# Patient Record
Sex: Male | Born: 2000 | Race: White | Hispanic: No | Marital: Single | Smoking: Never smoker
Health system: Southern US, Community
[De-identification: ages and names within clinical notes are randomized; demographics above are authoritative.]

## PROBLEM LIST (undated history)

## (undated) DIAGNOSIS — D6859 Other primary thrombophilia: Secondary | ICD-10-CM

## (undated) DIAGNOSIS — F913 Oppositional defiant disorder: Secondary | ICD-10-CM

---
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
## 2021-01-28 ENCOUNTER — Other Ambulatory Visit: Payer: Self-pay

## 2021-01-28 ENCOUNTER — Emergency Department (HOSPITAL_COMMUNITY): Payer: BC Managed Care – PPO

## 2021-01-28 ENCOUNTER — Emergency Department (HOSPITAL_COMMUNITY)
Admission: EM | Admit: 2021-01-28 | Discharge: 2021-01-28 | Disposition: A | Discharge status: Home / Self Care | Payer: BC Managed Care – PPO | Attending: Emergency Medicine | Admitting: Emergency Medicine

## 2021-01-28 ENCOUNTER — Encounter (HOSPITAL_COMMUNITY): Payer: Self-pay | Admitting: *Deleted

## 2021-01-28 DIAGNOSIS — M25532 Pain in left wrist: Secondary | ICD-10-CM | POA: Insufficient documentation

## 2021-01-28 DIAGNOSIS — W1830XA Fall on same level, unspecified, initial encounter: Secondary | ICD-10-CM | POA: Diagnosis not present

## 2021-01-28 NOTE — ED Triage Notes (Signed)
Larey Seat a couple of days ago, pain in left wrist

## 2021-01-28 NOTE — ED Provider Notes (Signed)
[facility] EMERGENCY DEPARTMENT Provider Note   CSN: [id number] Arrival date & time: 01/28/21  1557     History Chief Complaint  Patient presents with   Wrist Injury    [name] is a 20 y.o. male.   Wrist Injury  Patient presents with left wrist pain x3 days.  Happened when he fell, landed on his wrist with his full body weight.  Pain is constant, sharp, exacerbated by movement.  He has tried Tylenol with minimal relief.  No history of previous surgeries to the left wrist.  He is also tried icing it every day.  Past Medical History:  Diagnosis Date   ODD (oppositional defiant disorder)    Protein S deficiency (HCC)     There are no problems to display for this patient.   History reviewed. No pertinent surgical history.     No family history on file.  Social History   Tobacco Use   Smoking status: Never   Smokeless tobacco: Never  Vaping Use   Vaping Use: Never used  Substance Use Topics   Alcohol use: No   Drug use: No    Home Medications Prior to Admission medications   Medication Sig Start Date End Date Taking? Authorizing Provider  amphetamine-dextroamphetamine (ADDERALL) 30 MG tablet Take 30 mg by mouth 2 (two) times daily. 10/02/14   [provider]  azithromycin (ZITHROMAX) 250 MG tablet Take 1 tablet (250 mg total) by mouth daily. Take first 2 tablets together, then 1 every day until finished. 10/27/17   [name], PA-C  cephALEXin (KEFLEX) 500 MG capsule Take 2 capsules (1,000 mg total) by mouth 2 (two) times daily. 12/09/14   Burgess Amor, PA-C  ciprofloxacin (CIPRO) 500 MG tablet Take 1 tablet (500 mg total) by mouth 2 (two) times daily. One po bid x 7 days 07/26/17   Geoffery Lyons, MD  ibuprofen (ADVIL,MOTRIN) 600 MG tablet Take 1 tablet (600 mg total) by mouth every 6 (six) hours as needed. 10/27/17   [name], PA-C  sertraline (ZOLOFT) 25 MG tablet Take 25 mg by mouth at bedtime.    [provider]    Allergies     Patient has no known allergies.  Review of Systems   Review of Systems  Musculoskeletal:        Wrist pain   Physical Exam Updated Vital Signs BP (!) 159/83 (BP Location: Right Arm)   Pulse (!) 54   Temp 98.4 F (36.9 C) (Oral)   Resp 15   SpO2 100%   Physical Exam Vitals and nursing note reviewed. Exam conducted with a chaperone present.  Constitutional:      General: He is not in acute distress.    Appearance: Normal appearance.  HENT:     Head: Normocephalic and atraumatic.  Eyes:     General: No scleral icterus.    Extraocular Movements: Extraocular movements intact.     Pupils: Pupils are equal, round, and reactive to light.  Musculoskeletal:     Comments: Range of motion is fully intact, although there is some pain with flexion of the left wrist.  Radial pulses 2+, he is fully able to flex and extend his digits against resistance.  He is able to flex and extend his elbow as well.  Cap refill is less than 2 on each finger.  There is no obvious bony deformities.  No bony crepitus.  Skin:    Coloration: Skin is not jaundiced.  Neurological:  Mental Status: He is alert. Mental status is at baseline.     Coordination: Coordination normal.    ED Results / Procedures / Treatments   Labs (all labs ordered are listed, but only abnormal results are displayed) Labs Reviewed - No data to display  EKG None  Radiology DG Wrist Complete Left  Result Date: 01/28/2021 CLINICAL DATA:  Left wrist pain for a few days since a fall. Initial encounter. EXAM: LEFT WRIST - COMPLETE 3+ VIEW COMPARISON:  None. FINDINGS: There is no evidence of fracture or dislocation. There is no evidence of arthropathy or other focal bone abnormality. Soft tissues are unremarkable. IMPRESSION: Normal exam. Electronically Signed   By: Drusilla Kanner M.D.   On: 01/28/2021 17:58    Procedures Procedures   Medications Ordered in ED Medications - No data to display  ED Course  I have reviewed  the triage vital signs and the nursing notes.  Pertinent labs & imaging results that were available during my care of the patient were reviewed by me and considered in my medical decision making (see chart for details).    MDM Rules/Calculators/A&P                           Patient has stable vitals, he presents with pain to the left wrist.  Happened after a fall, will order radiograph to evaluate for possible dislocation or fracture.  Radiographs not show any sign of dislocation or fracture.  Physical exam is reassuring, he is neurovascularly intact.  He has full range of motion, I do not suspect compartment syndrome.  There is no shooting pain with ulnar deviation, I do not suspect ulnar nerve injury.  Tinel sign was negative.  Suspect this is a muscle strain and will improve with ibuprofen and Tylenol.  Final Clinical Impression(s) / ED Diagnoses Final diagnoses:  None    Rx / DC Orders ED Discharge Orders     None        Theron Arista, [name] 01/28/21 1818    Pollyann Savoy, MD 01/28/21 [phone]

## 2021-01-28 NOTE — Discharge Instructions (Addendum)
May continue icing your wrist for another day if you like, I recommend doing this for the first 3 days after the injury and you do not need to do anything after that. Continue taking Tylenol, he can also take 800 mg of ibuprofen every 6-8 hours as needed. Please follow-up with your primary care doctor if you need further evaluation.  It is not for improved the next 2 weeks I would recommend making an appointment. [facility] to establish care with a primary care provider as he can get follow-up.

## 2022-04-03 ENCOUNTER — Ambulatory Visit (INDEPENDENT_AMBULATORY_CARE_PROVIDER_SITE_OTHER): Payer: Medicaid Other

## 2022-04-03 ENCOUNTER — Ambulatory Visit
Admission: EM | Admit: 2022-04-03 | Discharge: 2022-04-03 | Disposition: A | Discharge status: Home / Self Care | Payer: Medicaid Other | Attending: Nurse Practitioner | Admitting: Nurse Practitioner

## 2022-04-03 DIAGNOSIS — L03031 Cellulitis of right toe: Secondary | ICD-10-CM | POA: Diagnosis not present

## 2022-04-03 DIAGNOSIS — M79674 Pain in right toe(s): Secondary | ICD-10-CM

## 2022-04-03 MED ORDER — CEPHALEXIN 500 MG PO CAPS: 500.0000 mg | ORAL_CAPSULE | Freq: Four times a day (QID) | ORAL | 0 refills | Status: AC

## 2022-04-03 NOTE — ED Triage Notes (Signed)
Pt states that he injured his right pinky toe. X2 days

## 2022-04-03 NOTE — ED Provider Notes (Signed)
[facility]    CSN: [id number] Arrival date & time: 04/03/22  1246      History   Chief Complaint Chief Complaint  Patient presents with   Toe Injury    Right pinky toe injury. X2 days    HPI [name] is a 21 y.o. male.   Patient present with significant other for a few days of right pinky toe pain after "stubbing it on a pumpkin."  Endorses pain mostly with touching the toe, denies any significant deformity.  Reports the nail broke off when he injured it and has been draining a little bit around the nail since the injury.  There is a little bit of redness and swelling.  No bruising, stiffness, numbness or tingling in toe, or fever, nausea/vomiting since it happened.  Has tried warm and cool compresses and ibuprofen without much relief.      Past Medical History:  Diagnosis Date   ODD (oppositional defiant disorder)    Protein S deficiency (HCC)     There are no problems to display for this patient.   History reviewed. No pertinent surgical history.     Home Medications    Prior to Admission medications   Medication Sig Start Date End Date Taking? Authorizing Provider  amphetamine-dextroamphetamine (ADDERALL) 30 MG tablet Take 30 mg by mouth 2 (two) times daily. 10/02/14   [provider]  cephALEXin (KEFLEX) 500 MG capsule Take 1 capsule (500 mg total) by mouth 4 (four) times daily for 7 days. 04/03/22 04/10/22 Yes Valentino Nose, NP  ibuprofen (ADVIL,MOTRIN) 600 MG tablet Take 1 tablet (600 mg total) by mouth every 6 (six) hours as needed. 10/27/17   [name], PA-C  sertraline (ZOLOFT) 25 MG tablet Take 25 mg by mouth at bedtime.    [provider]    Family History History reviewed. No pertinent family history.  Social History Social History   Tobacco Use   Smoking status: Never   Smokeless tobacco: Never  Vaping Use   Vaping Use: Never used  Substance Use Topics   Alcohol use: No   Drug use: No      Allergies   Patient has no known allergies.   Review of Systems Review of Systems Per HPI  Physical Exam Triage Vital Signs ED Triage Vitals  Enc Vitals Group     BP 04/03/22 1312 136/78     Pulse Rate 04/03/22 1312 (!) 52     Resp 04/03/22 1312 18     Temp 04/03/22 1312 98.6 F (37 C)     Temp Source 04/03/22 1312 Oral     SpO2 04/03/22 1312 97 %     Weight 04/03/22 1309 238 lb (108 kg)     Height 04/03/22 1309 5\' 8"  (1.727 m)     Head Circumference --      Peak Flow --      Pain Score 04/03/22 1309 6     Pain Loc --      Pain Edu? --      Excl. in GC? --    No data found.  Updated Vital Signs BP 136/78 (BP Location: Right Arm)   Pulse (!) 52   Temp 98.6 F (37 C) (Oral)   Resp 18   Ht 5\' 8"  (1.727 m)   Wt 238 lb (108 kg)   SpO2 97%   BMI 36.19 kg/m   Visual Acuity Right Eye Distance:   Left Eye Distance:   Bilateral  Distance:    Right Eye Near:   Left Eye Near:    Bilateral Near:     Physical Exam Vitals and nursing note reviewed.  Constitutional:      General: He is not in acute distress.    Appearance: Normal appearance. He is not toxic-appearing.  Pulmonary:     Effort: Pulmonary effort is normal. No respiratory distress.  Musculoskeletal:       Feet:  Feet:     Comments: Inspection: Mild swelling and erythema of the right distal phalange in approximately area marked.  Dried drainage noted to nailbed.  No obvious deformity noted. Palpation: Right distal phalange tender to palpation; no obvious deformities palpated  ROM: Full ROM to all 5 digits on right lower extremity Strength: 5/5 bilateral lower extremities Neurovascular: neurovascularly intact in left and right lower extremity   Skin:    General: Skin is warm and dry.     Capillary Refill: Capillary refill takes less than 2 seconds.     Coloration: Skin is not jaundiced or pale.     Findings: Erythema present. No rash.  Neurological:     Mental Status: He is alert and oriented  to person, place, and time.  Psychiatric:        Behavior: Behavior is cooperative.      UC Treatments / Results  Labs (all labs ordered are listed, but only abnormal results are displayed) Labs Reviewed - No data to display  EKG   Radiology DG Toe 5th Right  Result Date: 04/03/2022 CLINICAL DATA:  Right fifth toe pain after stubbing it on a pumpkin 2 days ago. EXAM: RIGHT FIFTH TOE COMPARISON:  None Available. FINDINGS: Normal bone mineralization. Joint spaces are preserved. No acute fracture is seen. No dislocation. IMPRESSION: No acute fracture. Electronically Signed   By: Yvonne Kendall M.D.   On: 04/03/2022 13:41    Procedures Procedures (including critical care time)  Medications Ordered in UC Medications - No data to display  Initial Impression / Assessment and Plan / UC Course  I have reviewed the triage vital signs and the nursing notes.  Pertinent labs & imaging results that were available during my care of the patient were reviewed by me and considered in my medical decision making (see chart for details).    Patient is well-appearing, normotensive, afebrile, not tachycardic, not tachypneic, oxygenating well on room air.  X-ray of toe is negative for acute bony abnormality.  Suspect cellulitis of the right pinky toe involving the nailbed.  We will treat with Keflex 500 mg 4 times daily for 5 days.  Discussed warm Epsom salt soaks.  Follow-up with podiatry with no improvement.  ER and return precautions discussed.  Note given for work.  The patient was given the opportunity to ask questions.  All questions answered to their satisfaction.  The patient is in agreement to this plan.   9 Final Clinical Impressions(s) / UC Diagnoses   Final diagnoses:  Toe pain, right  Cellulitis of toe of right foot     Discharge Instructions      X-ray today does not show any broken bones in your toe.  I suspect the pain is coming from infection around your toe nail.  Please start  the antibiotic and take it as prescribed.  Also start warm soaks/compresses to help draw out infection.  With no improvement or worsening symptoms, follow up with Podiatry (foot doctor) contact information is provided.     ED Prescriptions  Medication Sig Dispense Auth. Provider   cephALEXin (KEFLEX) 500 MG capsule Take 1 capsule (500 mg total) by mouth 4 (four) times daily for 7 days. 28 capsule Valentino Nose, NP      PDMP not reviewed this encounter.   Valentino Nose, NP 04/03/22 1556

## 2022-04-03 NOTE — Discharge Instructions (Signed)
X-ray today does not show any broken bones in your toe.  I suspect the pain is coming from infection around your toe nail.  Please start the antibiotic and take it as prescribed.  Also start warm soaks/compresses to help draw out infection.  With no improvement or worsening symptoms, follow up with Podiatry (foot doctor) contact information is provided.

## 2022-08-15 IMAGING — DX DG WRIST COMPLETE 3+V*L*
4 series · 4 of 4 positions shown · non-contrast
Comparison: None.

CLINICAL DATA: Left wrist pain for a few days since a fall. Initial
encounter.

EXAM:
LEFT WRIST - COMPLETE 3+ VIEW

[wrist pa]
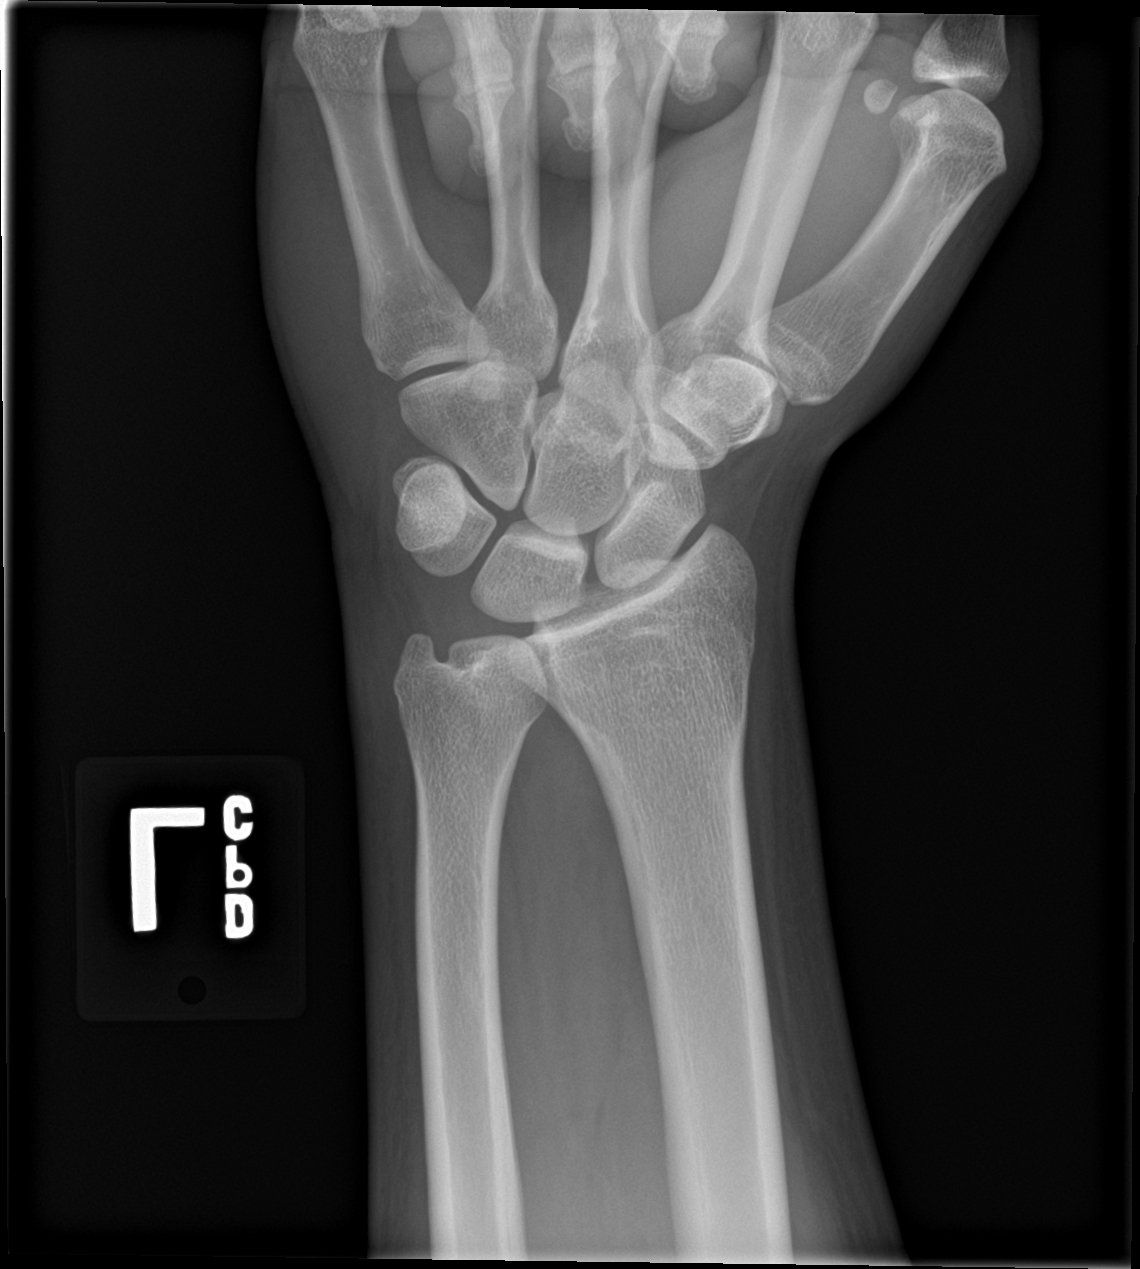

[wrist obl]
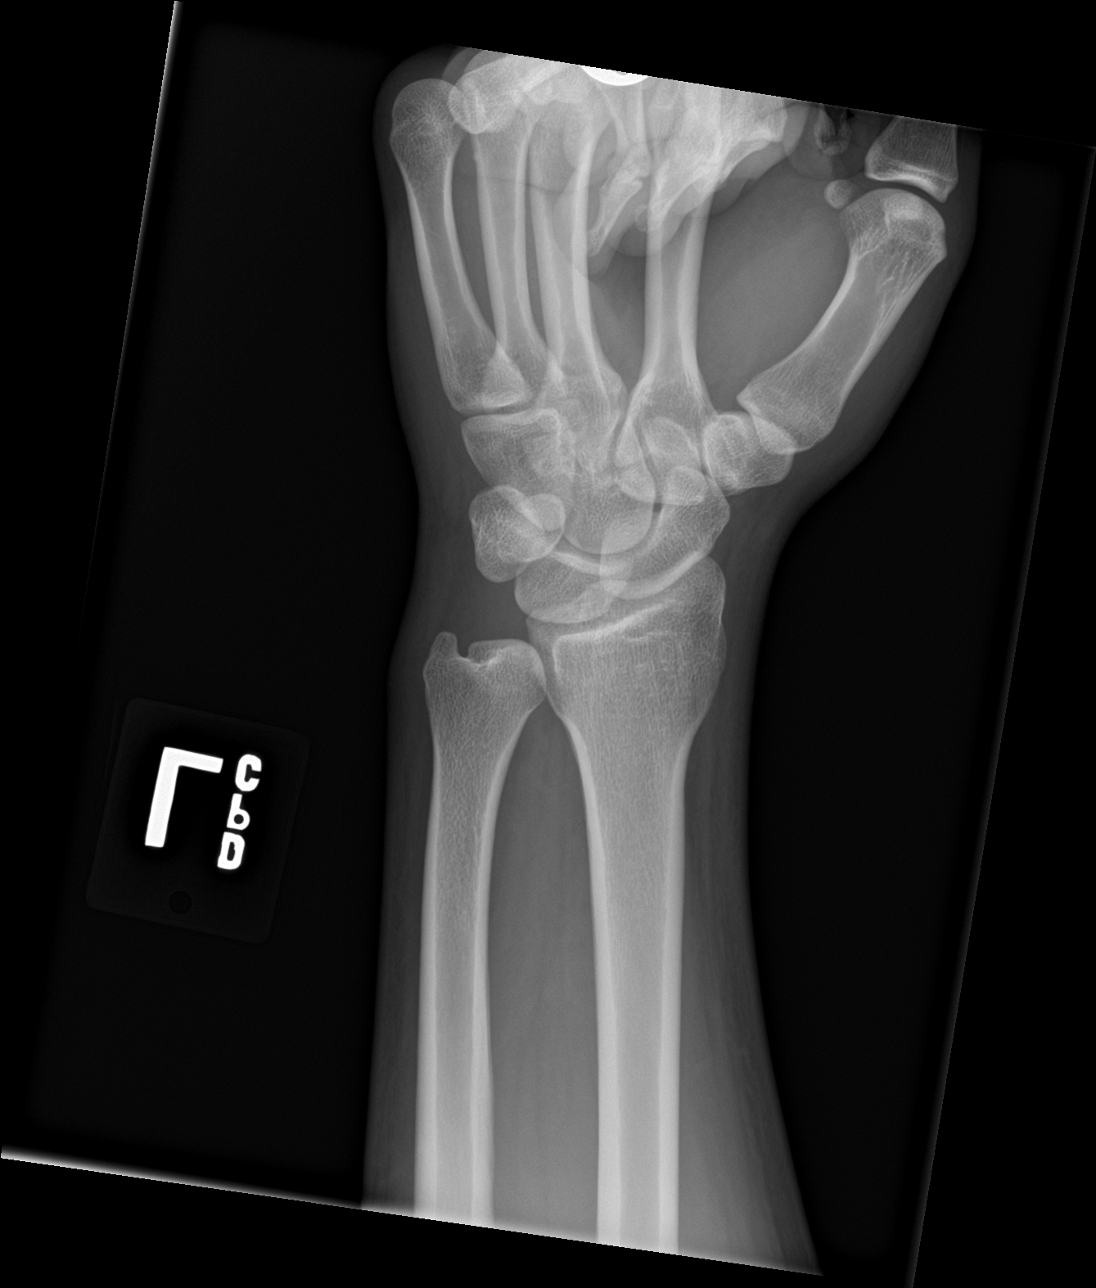

[wrist lat]
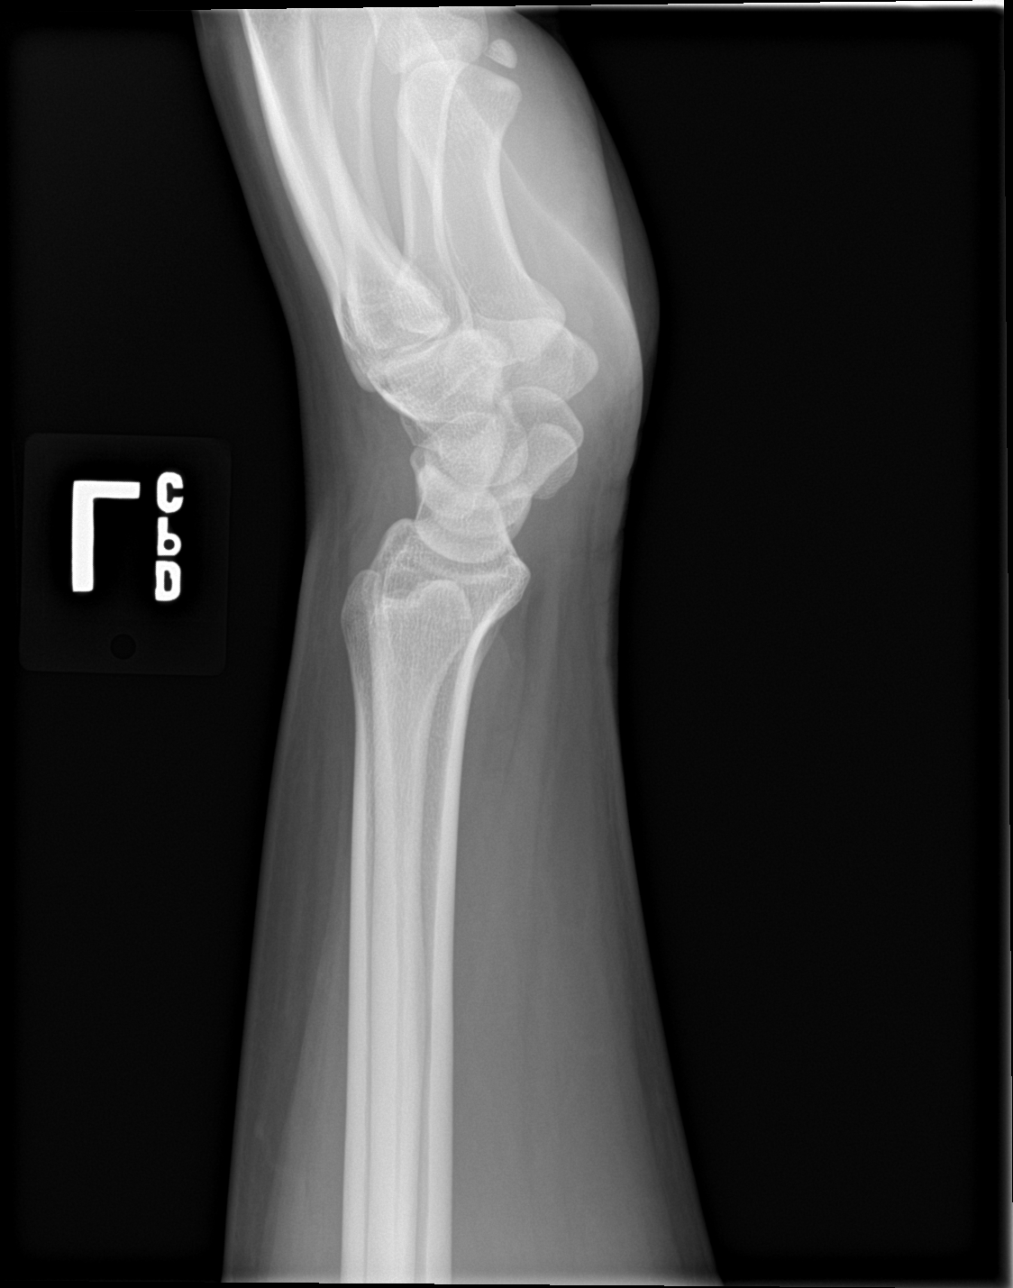

[wrist navicular]
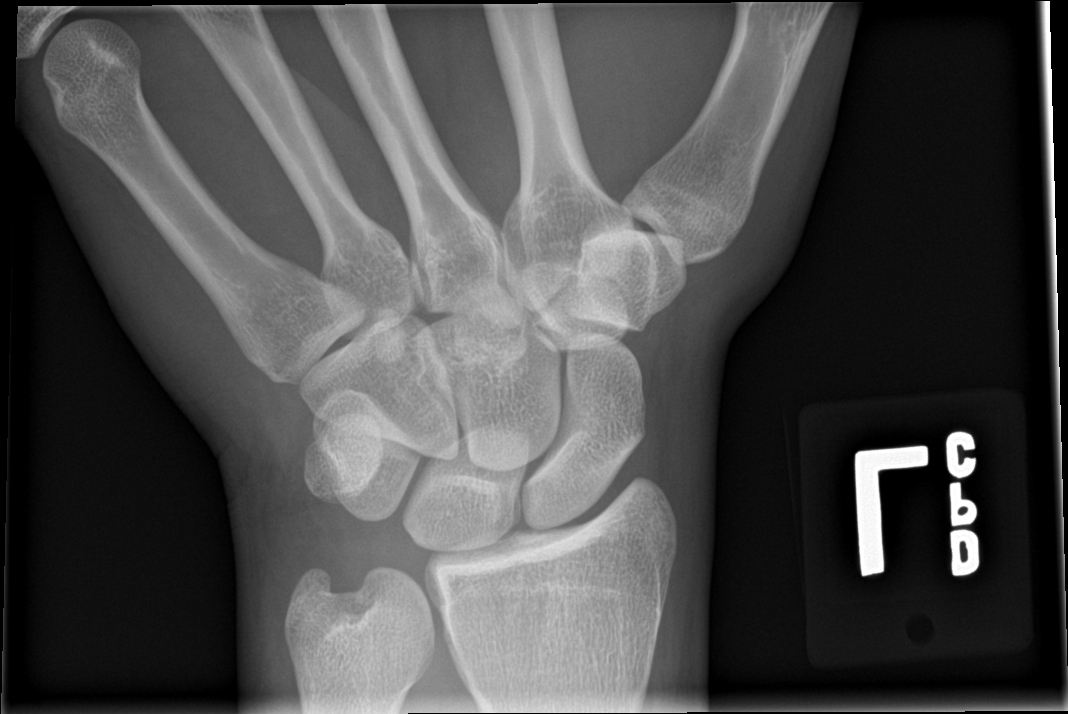

[4 of 4 positions shown; findings below may reference images not displayed]

FINDINGS: There is no evidence of fracture or dislocation. There is no
evidence of arthropathy or other focal bone abnormality. Soft
tissues are unremarkable.
IMPRESSION: Normal exam.

## 2022-09-06 ENCOUNTER — Ambulatory Visit: Payer: Medicaid Other | Admitting: Family Medicine

## 2022-11-20 ENCOUNTER — Ambulatory Visit: Payer: Medicaid Other | Admitting: Family Medicine

## 2022-11-20 ENCOUNTER — Encounter: Payer: Self-pay | Admitting: Family Medicine

## 2022-11-20 VITALS — BP 120/68 | HR 59 | Ht 68.0 in | Wt 252.2 lb

## 2022-11-20 DIAGNOSIS — M549 Dorsalgia, unspecified: Secondary | ICD-10-CM | POA: Insufficient documentation

## 2022-11-20 MED ORDER — MELOXICAM 15 MG PO TABS: 15.0000 mg | ORAL_TABLET | Freq: Every day | ORAL | 0 refills | Status: DC | PRN

## 2022-11-20 NOTE — Progress Notes (Signed)
Subjective:  Patient ID: [name], male    DOB: 08-13-2000  Age: 22 y.o. MRN: [id number]  CC: Chief Complaint  Patient presents with   Establish Care    HPI:  22 year old male presents to establish care.  Patient reports that he has been having upper back pain in the midline for the past 2 months.  No fall, trauma, injury.  He states when pressure is applied by his significant other it seems to help his pain.  He has also used Biofreeze.  He states that it occurs 1-2 times a week.  No radicular symptoms.  No other complaints.  Social Hx   Social History   Socioeconomic History   Marital status: Single    Spouse name: Not on file   Number of children: Not on file   Years of education: Not on file   Highest education level: Not on file  Occupational History   Not on file  Tobacco Use   Smoking status: Never   Smokeless tobacco: Never  Vaping Use   Vaping Use: Never used  Substance and Sexual Activity   Alcohol use: No   Drug use: No   Sexual activity: Not on file  Other Topics Concern   Not on file  Social History Narrative   Not on file   Social Determinants of Health   Financial Resource Strain: Not on file  Food Insecurity: Not on file  Transportation Needs: Not on file  Physical Activity: Not on file  Stress: Not on file  Social Connections: Not on file    Review of Systems Per HPI  Objective:  BP 120/68 (BP Location: Left Arm, Patient Position: Sitting, Cuff Size: Normal)   Pulse (!) 59   Ht 5\' 8"  (1.727 m)   Wt 252 lb 3.2 oz (114.4 kg)   SpO2 99%   BMI 38.35 kg/m      11/20/2022    9:41 AM 04/03/2022    1:12 PM 04/03/2022    1:09 PM  BP/Weight  Systolic BP 120 136   Diastolic BP 68 78   Wt. (Lbs) 252.2  238  BMI 38.35 kg/m2  36.19 kg/m2    Physical Exam Vitals and nursing note reviewed.  Constitutional:      General: He is not in acute distress.    Appearance: Normal appearance.  HENT:     Head: Normocephalic and atraumatic.   Eyes:     General:        Right eye: No discharge.        Left eye: No discharge.     Conjunctiva/sclera: Conjunctivae normal.  Cardiovascular:     Rate and Rhythm: Normal rate and regular rhythm.  Pulmonary:     Effort: Pulmonary effort is normal.     Breath sounds: Normal breath sounds. No wheezing, rhonchi or rales.  Musculoskeletal:     Comments: Upper thoracic spine nontender to palpation.  Neurological:     Mental Status: He is alert.  Psychiatric:        Mood and Affect: Mood normal.        Behavior: Behavior normal.     Lab Results  Component Value Date   WBC 12.6 04/29/2009   HGB 13.9 04/29/2009   HCT 39.3 04/29/2009   PLT 209 04/29/2009   GLUCOSE 103 (H) 04/29/2009   NA 138 04/29/2009   K 3.8 04/29/2009   CL 106 04/29/2009   CREATININE 0.43 04/29/2009   BUN 10 04/29/2009  CO2 23 04/29/2009     Assessment & Plan:   Problem List Items Addressed This Visit       Other   Upper back pain - Primary    X-ray for further valuation.  Meloxicam as directed.      Relevant Medications   meloxicam (MOBIC) 15 MG tablet   Other Relevant Orders   DG Thoracic Spine W/Swimmers    Meds ordered this encounter  Medications   meloxicam (MOBIC) 15 MG tablet    Sig: Take 1 tablet (15 mg total) by mouth daily as needed for pain.    Dispense:  30 tablet    Refill:  0    Follow-up:  Return if symptoms worsen or fail to improve.  Everlene Other DO [facility]

## 2022-11-20 NOTE — Patient Instructions (Signed)
Xray at the hospital.  Medication as directed.  Follow up annually.

## 2022-11-20 NOTE — Assessment & Plan Note (Signed)
X-ray for further valuation.  Meloxicam as directed.

## 2022-11-22 ENCOUNTER — Ambulatory Visit (HOSPITAL_COMMUNITY)
Admission: RE | Admit: 2022-11-22 | Discharge: 2022-11-22 | Disposition: A | Discharge status: Home / Self Care | Payer: Medicaid Other | Source: Ambulatory Visit | Attending: Family Medicine | Admitting: Family Medicine

## 2022-11-22 DIAGNOSIS — M549 Dorsalgia, unspecified: Secondary | ICD-10-CM | POA: Insufficient documentation

## 2023-01-08 ENCOUNTER — Emergency Department (HOSPITAL_COMMUNITY)
Admission: EM | Admit: 2023-01-08 | Discharge: 2023-01-08 | Disposition: A | Discharge status: Home / Self Care | Payer: Medicaid Other | Attending: Emergency Medicine | Admitting: Emergency Medicine

## 2023-01-08 ENCOUNTER — Encounter (HOSPITAL_COMMUNITY): Payer: Self-pay | Admitting: Emergency Medicine

## 2023-01-08 ENCOUNTER — Other Ambulatory Visit: Payer: Self-pay

## 2023-01-08 ENCOUNTER — Emergency Department (HOSPITAL_COMMUNITY): Payer: Medicaid Other

## 2023-01-08 DIAGNOSIS — S99922A Unspecified injury of left foot, initial encounter: Secondary | ICD-10-CM | POA: Diagnosis present

## 2023-01-08 DIAGNOSIS — W010XXA Fall on same level from slipping, tripping and stumbling without subsequent striking against object, initial encounter: Secondary | ICD-10-CM | POA: Diagnosis not present

## 2023-01-08 DIAGNOSIS — S92512A Displaced fracture of proximal phalanx of left lesser toe(s), initial encounter for closed fracture: Secondary | ICD-10-CM

## 2023-01-08 MED ORDER — MELOXICAM 15 MG PO TABS: 15.0000 mg | ORAL_TABLET | Freq: Every day | ORAL | 0 refills | Status: AC | PRN

## 2023-01-08 NOTE — ED Provider Notes (Signed)
  [name] Provider Note   CSN: [id number] Arrival date & time: 01/08/23  1934     History  Chief Complaint  Patient presents with   Toe Injury    Left pinky toe    [name] is a 22 y.o. male.  HPI     Home Medications Prior to Admission medications   Medication Sig Start Date End Date Taking? Authorizing Provider  meloxicam (MOBIC) 15 MG tablet Take 1 tablet (15 mg total) by mouth daily as needed for pain. 01/08/23   Eber Hong, MD      Allergies    Patient has no known allergies.    Review of Systems   Review of Systems  Constitutional:  Negative for fever.  Skin:  Positive for color change.    Physical Exam Updated Vital Signs BP (!) 147/98   Pulse 80   Temp 98.3 F (36.8 C) (Oral)   Resp 18   Ht 1.727 m (5\' 8" )   Wt 114.8 kg   SpO2 98%   BMI 38.47 kg/m  Physical Exam Vitals and nursing note reviewed.  Constitutional:      Appearance: He is well-developed. He is not diaphoretic.  HENT:     Head: Normocephalic and atraumatic.  Eyes:     General:        Right eye: No discharge.        Left eye: No discharge.     Conjunctiva/sclera: Conjunctivae normal.  Pulmonary:     Effort: Pulmonary effort is normal. No respiratory distress.  Musculoskeletal:        General: Swelling, tenderness, deformity and signs of injury present.     Comments: Injury to left small toe, bruising present, closed wound, minimal swelling  Skin:    General: Skin is warm and dry.     Findings: No erythema or rash.  Neurological:     Mental Status: He is alert.     Coordination: Coordination normal.     ED Results / Procedures / Treatments   Labs (all labs ordered are listed, but only abnormal results are displayed) Labs Reviewed - No data to display  EKG None  Radiology No results found.  Procedures Procedures    Medications Ordered in ED Medications - No data to display  ED Course/ Medical Decision  Making/ A&P                             Medical Decision Making Risk Prescription drug management.   I reviewed the patient's x-rays from the urgent care, there is a proximal fracture with what appears to be some displacement.  Will be placed in a buddy tape and a postop shoe, will give work note, patient agreeable, stable for discharge        Final Clinical Impression(s) / ED Diagnoses Final diagnoses:  Closed displaced fracture of proximal phalanx of lesser toe of left foot, initial encounter    Rx / DC Orders ED Discharge Orders          Ordered    meloxicam (MOBIC) 15 MG tablet  Daily PRN        01/08/23 2253              Eber Hong, MD 01/08/23 2254

## 2023-01-08 NOTE — ED Triage Notes (Signed)
Pt to ed after being seen at urgent care and sent here with imaging disc. Pt's left pinky toe is broken and displaced according to radiologist from [facility]. Pt was playing basketball outside barefoot when tripped and fell causing injury. Put unable to bare any weight on left foot at this time. Pt also has scrapes to left lateral leg.

## 2023-01-08 NOTE — Discharge Instructions (Addendum)
You do have a small fracture of your toe, you can follow-up with the orthopedic surgeon this week.  Wear the postop shoe, ibuprofen or Tylenol, keep it elevated, ice packs, ER for worsening symptoms

## 2023-01-08 NOTE — ED Notes (Signed)
Patient verbalizes understanding of discharge instructions. Opportunity for questioning and answers were provided. Armband removed by staff, pt discharged from ED. Wheeled out to lobby, driven home by girlfriend

## 2023-01-10 ENCOUNTER — Encounter: Payer: Self-pay | Admitting: Orthopedic Surgery

## 2023-01-10 ENCOUNTER — Ambulatory Visit (INDEPENDENT_AMBULATORY_CARE_PROVIDER_SITE_OTHER): Payer: Medicaid Other | Admitting: Orthopedic Surgery

## 2023-01-10 VITALS — BP 133/85 | HR 54 | Ht 68.0 in

## 2023-01-10 DIAGNOSIS — S92512A Displaced fracture of proximal phalanx of left lesser toe(s), initial encounter for closed fracture: Secondary | ICD-10-CM | POA: Diagnosis not present

## 2023-01-10 NOTE — Progress Notes (Signed)
New Patient Visit  Assessment: [name] is a 22 y.o. male with the following: 1. Displaced fracture of proximal phalanx of left lesser toe(s), initial encounter for closed fracture   Plan: [name] sustained a proximal phalanx fracture of the left small toe.  Continue to buddy tape.  Okay to bear weight through the heel.  Continue to use the postop shoe.  Tylenol and ibuprofen as needed.  Elevate foot to help with swelling.  I would like to see him back in 1 week for repeat evaluation.  At that time, we will obtain a updated x-rays.  Follow-up: Return in about 1 week (around 01/17/2023).  Subjective:  Chief Complaint  Patient presents with   Foot Injury    Slipped and fell 01/08/23 left small toe fractured     History of Present Illness: [name] is a 22 y.o. male who presents for evaluation of left foot pain.  A couple of days ago, he lost his balance and fell.  He twisted his left foot.  He had immediate pain in the small toe.  He was evaluated at an urgent care center.  He was diagnosed with a proximal phalanx fracture of the small toe, and has had his toes buddy taped.  He is using a postop shoe.  He refused narcotics.  He is taking Tylenol or ibuprofen.  He is unable to wear regular shoe.   Review of Systems: No fevers or chills No numbness or tingling No chest pain No shortness of breath No bowel or bladder dysfunction No GI distress No headaches   Medical History:  Past Medical History:  Diagnosis Date   ODD (oppositional defiant disorder)    Protein S deficiency (HCC)     History reviewed. No pertinent surgical history.  History reviewed. No pertinent family history. Social History   Tobacco Use   Smoking status: Never   Smokeless tobacco: Never  Vaping Use   Vaping Use: Never used  Substance Use Topics   Alcohol use: No   Drug use: No    No Known Allergies  Current Meds  Medication Sig   meloxicam (MOBIC) 15 MG tablet Take 1 tablet  (15 mg total) by mouth daily as needed for pain.    Objective: BP 133/85   Pulse (!) 54   Ht 5\' 8"  (1.727 m)   BMI 38.47 kg/m   Physical Exam:  General: Alert and oriented. and No acute distress. Gait: Left sided antalgic gait.  Evaluation of left foot demonstrates no obvious deformity.  There is bruising and swelling over the lateral aspect of the foot, especially the forefoot.  Tenderness palpation at the base of the small toe.  Overall alignment clinically looks within normal limits.  Toes warm and well-perfused.  IMAGING: I personally reviewed images previously obtained from the ED  X-rays of the left foot were available in clinic today.  There is a fracture of the proximal phalanx of the left small toe.  No additional injuries.  No dislocation.   New Medications:  No orders of the defined types were placed in this encounter.     Oliver Barre, MD  01/10/2023 11:47 AM

## 2023-01-10 NOTE — Patient Instructions (Signed)
Continue to take your toes together.  Okay to bear weight through your heel.  Continue to wear the postop shoe.  Elevate the foot to help with swelling and pain control.  Continue with Tylenol and ibuprofen as needed.

## 2023-01-17 ENCOUNTER — Encounter: Payer: Self-pay | Admitting: Orthopedic Surgery

## 2023-01-17 ENCOUNTER — Ambulatory Visit (INDEPENDENT_AMBULATORY_CARE_PROVIDER_SITE_OTHER): Payer: Medicaid Other | Admitting: Orthopedic Surgery

## 2023-01-17 ENCOUNTER — Other Ambulatory Visit (INDEPENDENT_AMBULATORY_CARE_PROVIDER_SITE_OTHER): Payer: Medicaid Other

## 2023-01-17 DIAGNOSIS — S92512A Displaced fracture of proximal phalanx of left lesser toe(s), initial encounter for closed fracture: Secondary | ICD-10-CM | POA: Diagnosis not present

## 2023-01-17 NOTE — Progress Notes (Signed)
Return patient Visit  Assessment: [name] is a 22 y.o. male with the following: 1. Displaced fracture of proximal phalanx of left lesser toe(s), subsequent encounter for closed fracture   Plan: [name] sustained a proximal phalanx fracture of the left small toe.  Radiographs demonstrate improved alignment.  Continue buddy taping.  Continue to use the postop shoe.  Elevate the foot.  Medicines as needed.  Provided a note for work, allowing him to return to light duty only.  He will follow-up in 3 weeks.   Follow-up: Return in about 3 weeks (around 02/07/2023).  Subjective:  Chief Complaint  Patient presents with   Fracture    L foot 01/08/23    History of Present Illness: [name] is a 22 y.o. male who returns for evaluation of left foot pain.  He sustained an injury to his left small toe approximately 10 days ago.  I saw him in clinic last week.  Since then, he has continued to buddy tape the toe.  He states his pain is better.  He is doing well in a postop shoe.  He has been icing his foot.  He has been elevating his foot.  Review of Systems: No fevers or chills No numbness or tingling No chest pain No shortness of breath No bowel or bladder dysfunction No GI distress No headaches    Objective: There were no vitals taken for this visit.  Physical Exam:  General: Alert and oriented. and No acute distress. Gait: Left sided antalgic gait.  Left foot without obvious deformity.  He does have bruising.  Tenderness to palpation.  Toes warm and well-perfused.  Sensation is intact.  IMAGING: I personally ordered and reviewed the following images  X-rays left foot were obtained in clinic today.  There is a minimally displaced, angulated fracture of the proximal phalanx of the left small toe.  Improved alignment compared to prior x-rays.  No additional injuries.  No bony lesions.  Impression: Left small toe, proximal phalanx fracture improved alignment  New  Medications:  No orders of the defined types were placed in this encounter.     Chris Barre, MD  01/17/2023 10:01 AM

## 2023-01-17 NOTE — Patient Instructions (Signed)
OK to return to work; light duty only.  No lifting anything greater than 10 pounds

## 2023-01-22 ENCOUNTER — Ambulatory Visit (INDEPENDENT_AMBULATORY_CARE_PROVIDER_SITE_OTHER): Payer: Medicaid Other | Admitting: Family Medicine

## 2023-01-22 VITALS — BP 107/71 | HR 60 | Temp 98.1°F | Ht 68.0 in | Wt 261.2 lb

## 2023-01-22 DIAGNOSIS — Z130 Encounter for screening for diseases of the blood and blood-forming organs and certain disorders involving the immune mechanism: Secondary | ICD-10-CM

## 2023-01-22 DIAGNOSIS — Z0001 Encounter for general adult medical examination with abnormal findings: Secondary | ICD-10-CM

## 2023-01-22 DIAGNOSIS — Z0000 Encounter for general adult medical examination without abnormal findings: Secondary | ICD-10-CM | POA: Insufficient documentation

## 2023-01-22 DIAGNOSIS — E669 Obesity, unspecified: Secondary | ICD-10-CM | POA: Diagnosis not present

## 2023-01-22 DIAGNOSIS — Z13220 Encounter for screening for lipoid disorders: Secondary | ICD-10-CM

## 2023-01-22 NOTE — Patient Instructions (Signed)
Labs today.  Follow up annually.  Take care  Dr. Cook  

## 2023-01-22 NOTE — Assessment & Plan Note (Signed)
Discussed preventative health care.  Screening labs today.  Declines HIV and hepatitis C screening.  Declines vaccines.

## 2023-01-22 NOTE — Progress Notes (Signed)
Subjective:  Patient ID: [name], male    DOB: 2001/01/18  Age: 22 y.o. MRN: [id number]  CC: Annual physical   HPI:  22 year old male presents for an annual examination.  Patient states that he is doing well.  He has no complaints or concerns at this time.  Blood pressure is well-controlled.  He is active.  Does not smoke or drink.  Discussed preventative health care today.  He declines hepatitis C and HIV screening.  He is amenable to routine labs including lipid panel.  Declines vaccines today.  Social Hx   Social History   Socioeconomic History   Marital status: Single    Spouse name: Not on file   Number of children: Not on file   Years of education: Not on file   Highest education level: Not on file  Occupational History   Not on file  Tobacco Use   Smoking status: Never   Smokeless tobacco: Never  Vaping Use   Vaping status: Never Used  Substance and Sexual Activity   Alcohol use: No   Drug use: No   Sexual activity: Not on file  Other Topics Concern   Not on file  Social History Narrative   Not on file   Social Determinants of Health   Financial Resource Strain: Not on file  Food Insecurity: Not on file  Transportation Needs: Not on file  Physical Activity: Not on file  Stress: Not on file  Social Connections: Not on file    Review of Systems  Constitutional: Negative.   Respiratory: Negative.    Cardiovascular: Negative.    Objective:  BP 107/71   Pulse 60   Temp 98.1 F (36.7 C)   Ht 5\' 8"  (1.727 m)   Wt 261 lb 3.2 oz (118.5 kg)   SpO2 98%   BMI 39.72 kg/m      01/22/2023    1:33 PM 01/10/2023   11:05 AM 01/08/2023    8:10 PM  BP/Weight  Systolic BP 107 133   Diastolic BP 71 85   Wt. (Lbs) 261.2  253  BMI 39.72 kg/m2  38.47 kg/m2    Physical Exam Vitals and nursing note reviewed.  Constitutional:      General: He is not in acute distress.    Appearance: Normal appearance.  HENT:     Head: Normocephalic and atraumatic.      Right Ear: Tympanic membrane normal.     Left Ear: Tympanic membrane normal.     Mouth/Throat:     Pharynx: Oropharynx is clear.  Eyes:     General:        Right eye: No discharge.        Left eye: No discharge.     Conjunctiva/sclera: Conjunctivae normal.  Cardiovascular:     Rate and Rhythm: Normal rate and regular rhythm.  Pulmonary:     Effort: Pulmonary effort is normal.     Breath sounds: Normal breath sounds. No wheezing, rhonchi or rales.  Abdominal:     General: There is no distension.     Palpations: Abdomen is soft.     Tenderness: There is no abdominal tenderness.  Musculoskeletal:     Cervical back: Neck supple.  Neurological:     General: No focal deficit present.     Mental Status: He is alert.  Psychiatric:        Mood and Affect: Mood normal.        Behavior: Behavior normal.  Lab Results  Component Value Date   WBC 12.6 04/29/2009   HGB 13.9 04/29/2009   HCT 39.3 04/29/2009   PLT 209 04/29/2009   GLUCOSE 103 (H) 04/29/2009   NA 138 04/29/2009   K 3.8 04/29/2009   CL 106 04/29/2009   CREATININE 0.43 04/29/2009   BUN 10 04/29/2009   CO2 23 04/29/2009     Assessment & Plan:   Problem List Items Addressed This Visit       Other   Annual physical exam - Primary    Discussed preventative health care.  Screening labs today.  Declines HIV and hepatitis C screening.  Declines vaccines.      Other Visit Diagnoses     Screening for deficiency anemia       Relevant Orders   CBC   Obesity (BMI 30-39.9)       Relevant Orders   CMP14+EGFR   Screening, lipid       Relevant Orders   Lipid panel       No orders of the defined types were placed in this encounter.   Follow-up:  No follow-ups on file.  Everlene Other DO [facility]

## 2023-01-23 LAB — CBC
Hematocrit: 44.3 % (ref 37.5–51.0)
Hemoglobin: 15.7 g/dL (ref 13.0–17.7)
MCH: 30.7 pg (ref 26.6–33.0)
MCHC: 35.4 g/dL (ref 31.5–35.7)
MCV: 87 fL (ref 79–97)
Platelets: 184 10*3/uL (ref 150–450)
RBC: 5.11 x10E6/uL (ref 4.14–5.80)
RDW: 12.4 % (ref 11.6–15.4)
WBC: 6.2 10*3/uL (ref 3.4–10.8)

## 2023-01-23 LAB — CMP14+EGFR
ALT: 99 IU/L — ABNORMAL HIGH (ref 0–44)
AST: 42 IU/L — ABNORMAL HIGH (ref 0–40)
Albumin: 4.5 g/dL (ref 4.3–5.2)
Alkaline Phosphatase: 75 IU/L (ref 51–125)
BUN/Creatinine Ratio: 17 (ref 9–20)
BUN: 12 mg/dL (ref 6–20)
Bilirubin Total: 0.4 mg/dL (ref 0.0–1.2)
CO2: 24 mmol/L (ref 20–29)
Calcium: 10 mg/dL (ref 8.7–10.2)
Chloride: 104 mmol/L (ref 96–106)
Creatinine, Ser: 0.71 mg/dL — ABNORMAL LOW (ref 0.76–1.27)
Globulin, Total: 2.2 g/dL (ref 1.5–4.5)
Glucose: 82 mg/dL (ref 70–99)
Potassium: 4.1 mmol/L (ref 3.5–5.2)
Sodium: 143 mmol/L (ref 134–144)
Total Protein: 6.7 g/dL (ref 6.0–8.5)
eGFR: 135 mL/min/{1.73_m2} (ref >59)

## 2023-01-23 LAB — LIPID PANEL
Chol/HDL Ratio: 3.5 ratio (ref 0.0–5.0)
Cholesterol, Total: 209 mg/dL — ABNORMAL HIGH (ref 100–199)
HDL: 60 mg/dL (ref >39)
LDL Chol Calc (NIH): 132 mg/dL — ABNORMAL HIGH (ref 0–99)
Triglycerides: 96 mg/dL (ref 0–149)
VLDL Cholesterol Cal: 17 mg/dL (ref 5–40)

## 2023-02-02 ENCOUNTER — Telehealth: Payer: Self-pay | Admitting: Orthopedic Surgery

## 2023-02-02 NOTE — Telephone Encounter (Signed)
[employer] forms received. To [employer].

## 2023-02-07 ENCOUNTER — Other Ambulatory Visit (INDEPENDENT_AMBULATORY_CARE_PROVIDER_SITE_OTHER): Payer: Medicaid Other

## 2023-02-07 ENCOUNTER — Encounter: Payer: Self-pay | Admitting: Orthopedic Surgery

## 2023-02-07 ENCOUNTER — Ambulatory Visit (INDEPENDENT_AMBULATORY_CARE_PROVIDER_SITE_OTHER): Payer: Medicaid Other | Admitting: Orthopedic Surgery

## 2023-02-07 VITALS — BP 146/85 | HR 80

## 2023-02-07 DIAGNOSIS — S92512D Displaced fracture of proximal phalanx of left lesser toe(s), subsequent encounter for fracture with routine healing: Secondary | ICD-10-CM

## 2023-02-07 DIAGNOSIS — S92512A Displaced fracture of proximal phalanx of left lesser toe(s), initial encounter for closed fracture: Secondary | ICD-10-CM

## 2023-02-07 NOTE — Progress Notes (Signed)
Return patient Visit  Assessment: [name] is a 21 y.o. male with the following: 1. Displaced fracture of proximal phalanx of left lesser toe(s), subsequent encounter for closed fracture   Plan: [name] sustained a proximal phalanx fracture of the left small toe.  Reviewed radiographs demonstrate stable alignment overall.  He is getting better.  Continue with buddy taping for an additional 2 weeks.  Can stop using the postop shoe in 2 weeks.  Elevate the foot to help with swelling.  Okay to transition to regular shoe before the next visit.  Follow-up in 1 month.  Follow-up: Return in about 4 weeks (around 03/07/2023).  Subjective:  Chief Complaint  Patient presents with   Foot Injury    Left     History of Present Illness: [name] is a 22 y.o. male who returns for evaluation of left foot pain.  He sustained a fracture to the left small toe, approximately 1 month ago.  He has been wearing a postop shoe, buddy taping his toes.  He is improving overall.  He has returned to work.  He is bearing weight through his heel.  No numbness or tingling.  Occasional pains with direct contact.   Review of Systems: No fevers or chills No numbness or tingling No chest pain No shortness of breath No bowel or bladder dysfunction No GI distress No headaches    Objective: BP (!) 146/85   Pulse 80   Physical Exam:  General: Alert and oriented. and No acute distress. Gait: Left sided antalgic gait.  Left foot without obvious deformity.  No bruising.  Mild tenderness to palpation over the small toe.  Sensation intact throughout the left foot.  Toes warm well-perfused.  IMAGING: I personally ordered and reviewed the following images  X-rays of the left foot were obtained in clinic today.  These are compared to prior x-rays.  There is a minimally displaced fracture of the proximal phalanx of the left small toe.  Compared to prior x-rays there has been no interval  displacement.  No obvious callus formation.  Impression: Stable left proximal phalanx fracture of the small toe.  New Medications:  No orders of the defined types were placed in this encounter.     Oliver Barre, MD  02/07/2023 11:56 AM

## 2023-03-13 ENCOUNTER — Other Ambulatory Visit (INDEPENDENT_AMBULATORY_CARE_PROVIDER_SITE_OTHER): Payer: Medicaid Other

## 2023-03-13 ENCOUNTER — Encounter: Payer: Self-pay | Admitting: Orthopedic Surgery

## 2023-03-13 ENCOUNTER — Ambulatory Visit (INDEPENDENT_AMBULATORY_CARE_PROVIDER_SITE_OTHER): Payer: Medicaid Other | Admitting: Orthopedic Surgery

## 2023-03-13 DIAGNOSIS — S92512D Displaced fracture of proximal phalanx of left lesser toe(s), subsequent encounter for fracture with routine healing: Secondary | ICD-10-CM

## 2023-03-13 NOTE — Progress Notes (Signed)
Return patient Visit  Assessment: [name] is a 22 y.o. male with the following: 1. Displaced fracture of proximal phalanx of left lesser toe(s), subsequent encounter for closed fracture   Plan: Gaetana Michaelis Gallacher sustained a proximal phalanx fracture of the left small toe.  Radiographs obtained in clinic today demonstrate stable alignment.  There is evidence of healing.  His pain is much better.  He will transition out of the postop shoe.  Okay to bear weight as tolerated for 4.  Medications as needed.  Elevate to help with swelling.  Contact clinic with questions or concerns.  Follow-up as needed.  Follow-up: Return if symptoms worsen or fail to improve.  Subjective:  Chief Complaint  Patient presents with   Fracture    L foot lesser toe DOI 01/08/23    History of Present Illness: Jaideep Rudy Piechota is a 22 y.o. male who returns for evaluation of left foot pain.  Sustained a fracture to his left small toe approximately 2 months ago.  He has been wearing a postop shoe.  He has been buddy taping his toes.  His pain is getting better.  He has taken a few steps without the shoe on, but has not tried using regular shoe.   Review of Systems: No fevers or chills No numbness or tingling No chest pain No shortness of breath No bowel or bladder dysfunction No GI distress No headaches    Objective: There were no vitals taken for this visit.  Physical Exam:  General: Alert and oriented. and No acute distress. Gait: Left sided antalgic gait.  Evaluation of the left foot is without deformity.  No bruising.  No tenderness to palpation along the small toe.  Sensation intact throughout the left foot.  Toes warm and well-perfused.  Active flexion and extension of the small toe is intact.   IMAGING: I personally ordered and reviewed the following images  X-rays of the left foot were obtained in clinic today.  These are compared to available x-rays.  Minimal displaced fracture of the  proximal phalanx of the left small toe.  There is been interval consolidation since the most recent x-rays.  No further displacement.  Everything is in stable alignment.  No dislocation.  No bony lesions.  Impression: Healed left small toe proximal phalanx fracture  New Medications:  No orders of the defined types were placed in this encounter.     Oliver Barre, MD  03/13/2023 2:07 PM

## 2023-03-13 NOTE — Patient Instructions (Signed)
Okay to remove the fracture shoe.  You do not need to continue taping her toes.  Medications as needed  Transition to a regular shoe  Please contact the clinic if you have any questions or concerns

## 2023-11-27 ENCOUNTER — Ambulatory Visit: Admitting: Orthopedic Surgery

## 2024-06-09 ENCOUNTER — Other Ambulatory Visit: Payer: Self-pay | Admitting: Family Medicine

## 2024-06-09 DIAGNOSIS — Z3169 Encounter for other general counseling and advice on procreation: Secondary | ICD-10-CM
